# Patient Record
Sex: Male | Born: 1975 | Race: Black or African American | Hispanic: No | Marital: Married | Smoking: Current every day smoker
Health system: Southern US, Community
[De-identification: ages and names within clinical notes are randomized; demographics above are authoritative.]

## PROBLEM LIST (undated history)

## (undated) DIAGNOSIS — Q249 Congenital malformation of heart, unspecified: Secondary | ICD-10-CM

---

## 2002-12-29 ENCOUNTER — Emergency Department (HOSPITAL_COMMUNITY): Admission: EM | Admit: 2002-12-29 | Discharge: 2002-12-30 | Payer: Self-pay | Admitting: Emergency Medicine

## 2002-12-30 ENCOUNTER — Encounter: Payer: Self-pay | Admitting: Emergency Medicine

## 2003-09-26 ENCOUNTER — Emergency Department (HOSPITAL_COMMUNITY): Admission: EM | Admit: 2003-09-26 | Discharge: 2003-09-26 | Payer: Self-pay | Admitting: *Deleted

## 2004-08-09 ENCOUNTER — Emergency Department (HOSPITAL_COMMUNITY): Admission: EM | Admit: 2004-08-09 | Discharge: 2004-08-09 | Payer: Self-pay | Admitting: Emergency Medicine

## 2004-11-08 ENCOUNTER — Emergency Department (HOSPITAL_COMMUNITY): Admission: EM | Admit: 2004-11-08 | Discharge: 2004-11-08 | Payer: Self-pay | Admitting: Family Medicine

## 2007-04-04 ENCOUNTER — Emergency Department (HOSPITAL_COMMUNITY): Admission: EM | Admit: 2007-04-04 | Discharge: 2007-04-04 | Payer: Self-pay | Admitting: Emergency Medicine

## 2015-03-13 ENCOUNTER — Emergency Department (HOSPITAL_COMMUNITY): Payer: Self-pay

## 2015-03-13 ENCOUNTER — Encounter (HOSPITAL_COMMUNITY): Payer: Self-pay | Admitting: Emergency Medicine

## 2015-03-13 DIAGNOSIS — Z720 Tobacco use: Secondary | ICD-10-CM | POA: Insufficient documentation

## 2015-03-13 DIAGNOSIS — Q249 Congenital malformation of heart, unspecified: Secondary | ICD-10-CM | POA: Insufficient documentation

## 2015-03-13 DIAGNOSIS — J40 Bronchitis, not specified as acute or chronic: Secondary | ICD-10-CM | POA: Insufficient documentation

## 2015-03-13 DIAGNOSIS — Z79899 Other long term (current) drug therapy: Secondary | ICD-10-CM | POA: Insufficient documentation

## 2015-03-13 DIAGNOSIS — R0789 Other chest pain: Secondary | ICD-10-CM | POA: Insufficient documentation

## 2015-03-13 LAB — BASIC METABOLIC PANEL
Anion gap: 11 (ref 5–15)
BUN: 12 mg/dL (ref 6–20)
CALCIUM: 8.4 mg/dL — AB (ref 8.9–10.3)
CO2: 24 mmol/L (ref 22–32)
CREATININE: 1.26 mg/dL — AB (ref 0.61–1.24)
Chloride: 98 mmol/L — ABNORMAL LOW (ref 101–111)
Glucose, Bld: 100 mg/dL — ABNORMAL HIGH (ref 65–99)
Potassium: 3.2 mmol/L — ABNORMAL LOW (ref 3.5–5.1)
SODIUM: 133 mmol/L — AB (ref 135–145)

## 2015-03-13 LAB — CBC
HCT: 36.9 % — ABNORMAL LOW (ref 39.0–52.0)
Hemoglobin: 12.1 g/dL — ABNORMAL LOW (ref 13.0–17.0)
MCH: 22.9 pg — AB (ref 26.0–34.0)
MCHC: 32.8 g/dL (ref 30.0–36.0)
MCV: 69.8 fL — ABNORMAL LOW (ref 78.0–100.0)
PLATELETS: 201 10*3/uL (ref 150–400)
RBC: 5.29 MIL/uL (ref 4.22–5.81)
RDW: 15 % (ref 11.5–15.5)
WBC: 7.3 10*3/uL (ref 4.0–10.5)

## 2015-03-13 LAB — I-STAT TROPONIN, ED: TROPONIN I, POC: 0 ng/mL (ref 0.00–0.08)

## 2015-03-13 NOTE — ED Notes (Signed)
Pt reports that 2 hours ago he started having loss of appetite, cramping in stomach and chest. Pt concerned he is dehydrated. Denies nvd. Pt reports nasal congestion.

## 2015-03-14 ENCOUNTER — Emergency Department (HOSPITAL_COMMUNITY)
Admission: EM | Admit: 2015-03-14 | Discharge: 2015-03-14 | Disposition: A | Discharge status: Home / Self Care | Payer: Self-pay | Attending: Emergency Medicine | Admitting: Emergency Medicine

## 2015-03-14 DIAGNOSIS — R079 Chest pain, unspecified: Secondary | ICD-10-CM

## 2015-03-14 DIAGNOSIS — J40 Bronchitis, not specified as acute or chronic: Secondary | ICD-10-CM

## 2015-03-14 HISTORY — DX: Congenital malformation of heart, unspecified: Q24.9

## 2015-03-14 LAB — I-STAT TROPONIN, ED: Troponin i, poc: 0 ng/mL (ref 0.00–0.08)

## 2015-03-14 MED ORDER — PREDNISONE 20 MG PO TABS
60.0000 mg | ORAL_TABLET | Freq: Once | ORAL | Status: AC
  Administered 2015-03-14: 60 mg via ORAL
  Filled 2015-03-14: qty 3

## 2015-03-14 MED ORDER — ALBUTEROL SULFATE HFA 108 (90 BASE) MCG/ACT IN AERS: 2.0000 | INHALATION_SPRAY | RESPIRATORY_TRACT | Status: AC | PRN

## 2015-03-14 MED ORDER — PREDNISONE 20 MG PO TABS: 60.0000 mg | ORAL_TABLET | Freq: Every day | ORAL | Status: AC

## 2015-03-14 MED ORDER — ALBUTEROL SULFATE HFA 108 (90 BASE) MCG/ACT IN AERS
2.0000 | INHALATION_SPRAY | Freq: Once | RESPIRATORY_TRACT | Status: AC
  Administered 2015-03-14: 2 via RESPIRATORY_TRACT
  Filled 2015-03-14: qty 6.7

## 2015-03-14 MED ORDER — SODIUM CHLORIDE 0.9 % IV BOLUS (SEPSIS)
1000.0000 mL | Freq: Once | INTRAVENOUS | Status: AC
  Administered 2015-03-14: 1000 mL via INTRAVENOUS

## 2015-03-14 NOTE — ED Provider Notes (Signed)
CSN: [id number]     Arrival date & time 03/13/15  2208 History   This chart was scribed for Shon Baton, MD by Lyndel Safe, ED Scribe. This patient was seen in room A06C/A06C and the patient's care was started 12:26 AM.   No chief complaint on file.  The history is provided by the patient. No language interpreter was used.   HPI Comments: Daniel Hart is a 39 y.o. male, with a PMhx asthma, who presents to the Emergency Department complaining of sudden onset, intermittent, non-radiating, generalized chest pain that he describes as a tightness onset 5 hours ago. He additionally reports nasal congestion and feeling like he is dehydrated stating he has not been drinking water for the past several days. He does not note movement to exacerbate his chest pain. Pt states he has used an inhaler in the past. Denies cough, fever, SOB, nausea, vomiting, diarrhea, abdominal pain or sick contacts.   Past Medical History  Diagnosis Date  . Heart defect, congenital    History reviewed. No pertinent past surgical history. No family history on file. Social History  Substance Use Topics  . Smoking status: Current Every Day Smoker    Types: Cigarettes  . Smokeless tobacco: None  . Alcohol Use: No    Review of Systems  Constitutional: Negative for fever.  HENT: Positive for congestion.   Respiratory: Positive for chest tightness. Negative for cough and shortness of breath.   Gastrointestinal: Negative for nausea, vomiting, abdominal pain and diarrhea.  Skin: Negative for rash.  All other systems reviewed and are negative.  Allergies  Review of patient's allergies indicates no known allergies.  Home Medications   Prior to Admission medications   Medication Sig Start Date End Date Taking? Authorizing Provider  albuterol (PROVENTIL HFA;VENTOLIN HFA) 108 (90 BASE) MCG/ACT inhaler Inhale 2 puffs into the lungs every 4 (four) hours as needed for wheezing or shortness of breath. 03/14/15    Shon Baton, MD  predniSONE (DELTASONE) 20 MG tablet Take 3 tablets (60 mg total) by mouth daily with breakfast. 03/14/15   Shon Baton, MD   BP 124/71 mmHg  Pulse 71  Temp(Src) 98.1 F (36.7 C) (Oral)  Resp 16  SpO2 100% Physical Exam  Constitutional: He is oriented to person, place, and time. He appears well-developed and well-nourished. No distress.  HENT:  Head: Normocephalic and atraumatic.  Cardiovascular: Normal rate, regular rhythm and normal heart sounds.   No murmur heard. Pulmonary/Chest: Effort normal. No respiratory distress. He has wheezes. He exhibits no tenderness.  Abdominal: Soft. Bowel sounds are normal. There is no tenderness. There is no rebound.  Musculoskeletal: He exhibits no edema.  Neurological: He is alert and oriented to person, place, and time.  Skin: Skin is warm and dry.  Psychiatric: He has a normal mood and affect.  Nursing note and vitals reviewed.   ED Course  Procedures  DIAGNOSTIC STUDIES: Oxygen Saturation is 100% on RA, normal by my interpretation.    COORDINATION OF CARE: 12:32 AM Discussed treatment plan with pt. Pt acknowledges and agrees to plan.   Labs Review Labs Reviewed  BASIC METABOLIC PANEL - Abnormal; Notable for the following:    Sodium 133 (*)    Potassium 3.2 (*)    Chloride 98 (*)    Glucose, Bld 100 (*)    Creatinine, Ser 1.26 (*)    Calcium 8.4 (*)    All other components within normal limits  CBC - Abnormal; Notable for the  following:    Hemoglobin 12.1 (*)    HCT 36.9 (*)    MCV 69.8 (*)    MCH 22.9 (*)    All other components within normal limits  I-STAT TROPOININ, ED  Rosezena Sensor, ED    Imaging Review Dg Chest 2 View  03/13/2015   CLINICAL DATA:  Chest pain and cramping tonight. 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  EXAM: CHEST  2 VIEW  COMPARISON:  08/17/2004  FINDINGS: Single clip overlies the middle mediastinum. The cardiomediastinal contours are unchanged.  Mild hyperinflation and bronchial thickening. Pulmonary vasculature is normal. Small calcified granulomas in the right midlung zone. No consolidation, pleural effusion, or pneumothorax. No acute osseous abnormalities are seen. Defect about posterior left fourth rib is unchanged and likely postsurgical.  IMPRESSION: Mild hyperinflation and bronchial thickening, can be seen with bronchitis or asthma.   Electronically Signed   By: Rubye Oaks M.D.   On: 03/13/2015 23:09   I have personally reviewed and evaluated these images and lab results as part of my medical decision-making.   EKG Interpretation   Date/Time:  Tuesday March 13 2015 22:12:55 EDT Ventricular Rate:  83 PR Interval:  162 QRS Duration: 110 QT Interval:  396 QTC Calculation: 465 R Axis:   95 Text Interpretation:  Normal sinus rhythm Rightward axis T wave  abnormality, consider inferior ischemia Prolonged QT Abnormal ECG T wave  inversion inferiorly and laterally Confirmed by Charli Halle  MD, Clayton Jarmon  ([id number]) on 03/14/2015 1:05:38 AM      MDM   Final diagnoses:  Chest pain, unspecified chest pain type  Bronchitis    Patient presents with chest pain. Also reports congestion. Current smoker. Wheezing on exam. Otherwise nontoxic and satting 100% on room air. Suspect chest pain is related to acute bronchitis. EKG reassuring and delta troponin is negative. Chest x-ray shows bronchial thickening consistent with bronchitis. Patient given an inhaler and steroids.  Patient with some improvement. Will discharge with steroids and inhaler at home.  After history, exam, and medical workup I feel the patient has been appropriately medically screened and is safe for discharge home. Pertinent diagnoses were discussed with the patient. Patient was given return precautions.  I personally performed the services described in this documentation, which was scribed in my presence. The recorded information has been reviewed and is  accurate.    Shon Baton, MD 03/14/15 [phone]

## 2015-03-14 NOTE — Discharge Instructions (Signed)
You were seen today for chest pain. You are wheezing and likely have some element of bronchitis.  You should quit smoking. You will be placed on steroids and an inhaler.  Chest Pain (Nonspecific) It is often hard to give a diagnosis for the cause of chest pain. There is always a chance that your pain could be related to something serious, such as a heart attack or a blood clot in the lungs. You need to follow up with your doctor. HOME CARE  If antibiotic medicine was given, take it as directed by your doctor. Finish the medicine even if you start to feel better.  For the next few days, avoid activities that bring on chest pain. Continue physical activities as told by your doctor.  Do not use any tobacco products. This includes cigarettes, chewing tobacco, and e-cigarettes.  Avoid drinking alcohol.  Only take medicine as told by your doctor.  Follow your doctor's suggestions for more testing if your chest pain does not go away.  Keep all doctor visits you made. GET HELP IF:  Your chest pain does not go away, even after treatment.  You have a rash with blisters on your chest.  You have a fever. GET HELP RIGHT AWAY IF:   You have more pain or pain that spreads to your arm, neck, jaw, back, or belly (abdomen).  You have shortness of breath.  You cough more than usual or cough up blood.  You have very bad back or belly pain.  You feel sick to your stomach (nauseous) or throw up (vomit).  You have very bad weakness.  You pass out (faint).  You have chills. This is an emergency. Do not wait to see if the problems will go away. Call your local emergency services (911 in [place] Do not drive yourself to the hospital. MAKE SURE YOU:   Understand these instructions.  Will watch your condition.  Will get help right away if you are not doing well or get worse. Document Released: 12/24/2007 Document Revised: 07/12/2013 Document Reviewed: 12/24/2007 [facility] Patient Information  2015 [place], [place]. This information is not intended to replace advice given to you by your health care provider. Make sure you discuss any questions you have with your health care provider. Acute Bronchitis Bronchitis is inflammation of the airways that extend from the windpipe into the lungs (bronchi). The inflammation often causes mucus to develop. This leads to a cough, which is the most common symptom of bronchitis.  In acute bronchitis, the condition usually develops suddenly and goes away over time, usually in a couple weeks. Smoking, allergies, and asthma can make bronchitis worse. Repeated episodes of bronchitis may cause further lung problems.  CAUSES Acute bronchitis is most often caused by the same virus that causes a cold. The virus can spread from person to person (contagious) through coughing, sneezing, and touching contaminated objects. SIGNS AND SYMPTOMS   Cough.   Fever.   Coughing up mucus.   Body aches.   Chest congestion.   Chills.   Shortness of breath.   Sore throat.  DIAGNOSIS  Acute bronchitis is usually diagnosed through a physical exam. Your health care provider will also ask you questions about your medical history. Tests, such as chest X-rays, are sometimes done to rule out other conditions.  TREATMENT  Acute bronchitis usually goes away in a couple weeks. Oftentimes, no medical treatment is necessary. Medicines are sometimes given for relief of fever or cough. Antibiotic medicines are usually not needed but may  be prescribed in certain situations. In some cases, an inhaler may be recommended to help reduce shortness of breath and control the cough. A cool mist vaporizer may also be used to help thin bronchial secretions and make it easier to clear the chest.  HOME CARE INSTRUCTIONS  Get plenty of rest.   Drink enough fluids to keep your urine clear or pale yellow (unless you have a medical condition that requires fluid restriction). Increasing  fluids may help thin your respiratory secretions (sputum) and reduce chest congestion, and it will prevent dehydration.   Take medicines only as directed by your health care provider.  If you were prescribed an antibiotic medicine, finish it all even if you start to feel better.  Avoid smoking and secondhand smoke. Exposure to cigarette smoke or irritating chemicals will make bronchitis worse. If you are a smoker, consider using nicotine gum or skin patches to help control withdrawal symptoms. Quitting smoking will help your lungs heal faster.   Reduce the chances of another bout of acute bronchitis by washing your hands frequently, avoiding people with cold symptoms, and trying not to touch your hands to your mouth, nose, or eyes.   Keep all follow-up visits as directed by your health care provider.  SEEK MEDICAL CARE IF: Your symptoms do not improve after 1 week of treatment.  SEEK IMMEDIATE MEDICAL CARE IF:  You develop an increased fever or chills.   You have chest pain.   You have severe shortness of breath.  You have bloody sputum.   You develop dehydration.  You faint or repeatedly feel like you are going to pass out.  You develop repeated vomiting.  You develop a severe headache. MAKE SURE YOU:   Understand these instructions.  Will watch your condition.  Will get help right away if you are not doing well or get worse. Document Released: 08/14/2004 Document Revised: 11/21/2013 Document Reviewed: 12/28/2012 [facility] Patient Information 2015 [place], [place]. This information is not intended to replace advice given to you by your health care provider. Make sure you discuss any questions you have with your health care provider.

## 2017-06-28 IMAGING — DX DG CHEST 2V
2 series · 2 of 2 positions shown · non-contrast
Comparison: 08/17/2004

CLINICAL DATA: Chest pain and cramping tonight. History of
congenital heart defect postsurgical repair as a baby.

EXAM:
CHEST  2 VIEW

[chest pa]
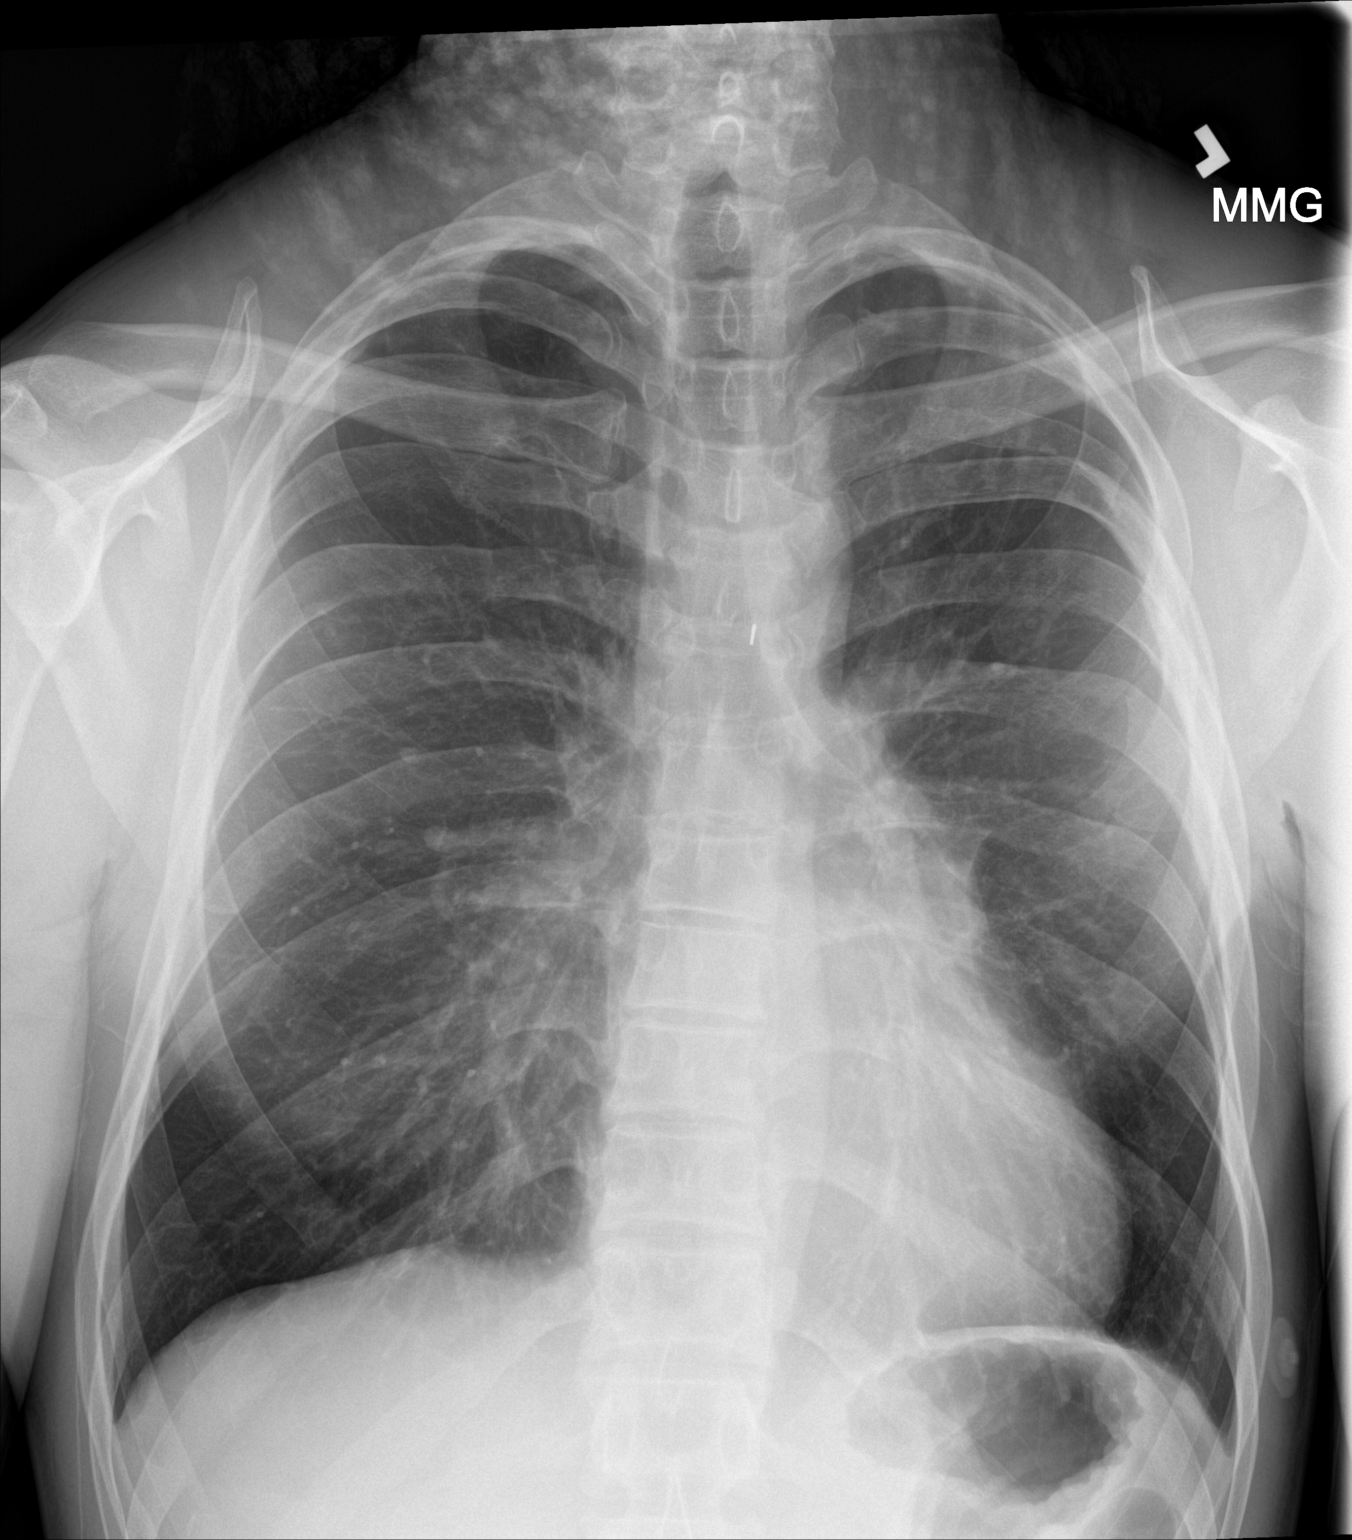

[chest lat]
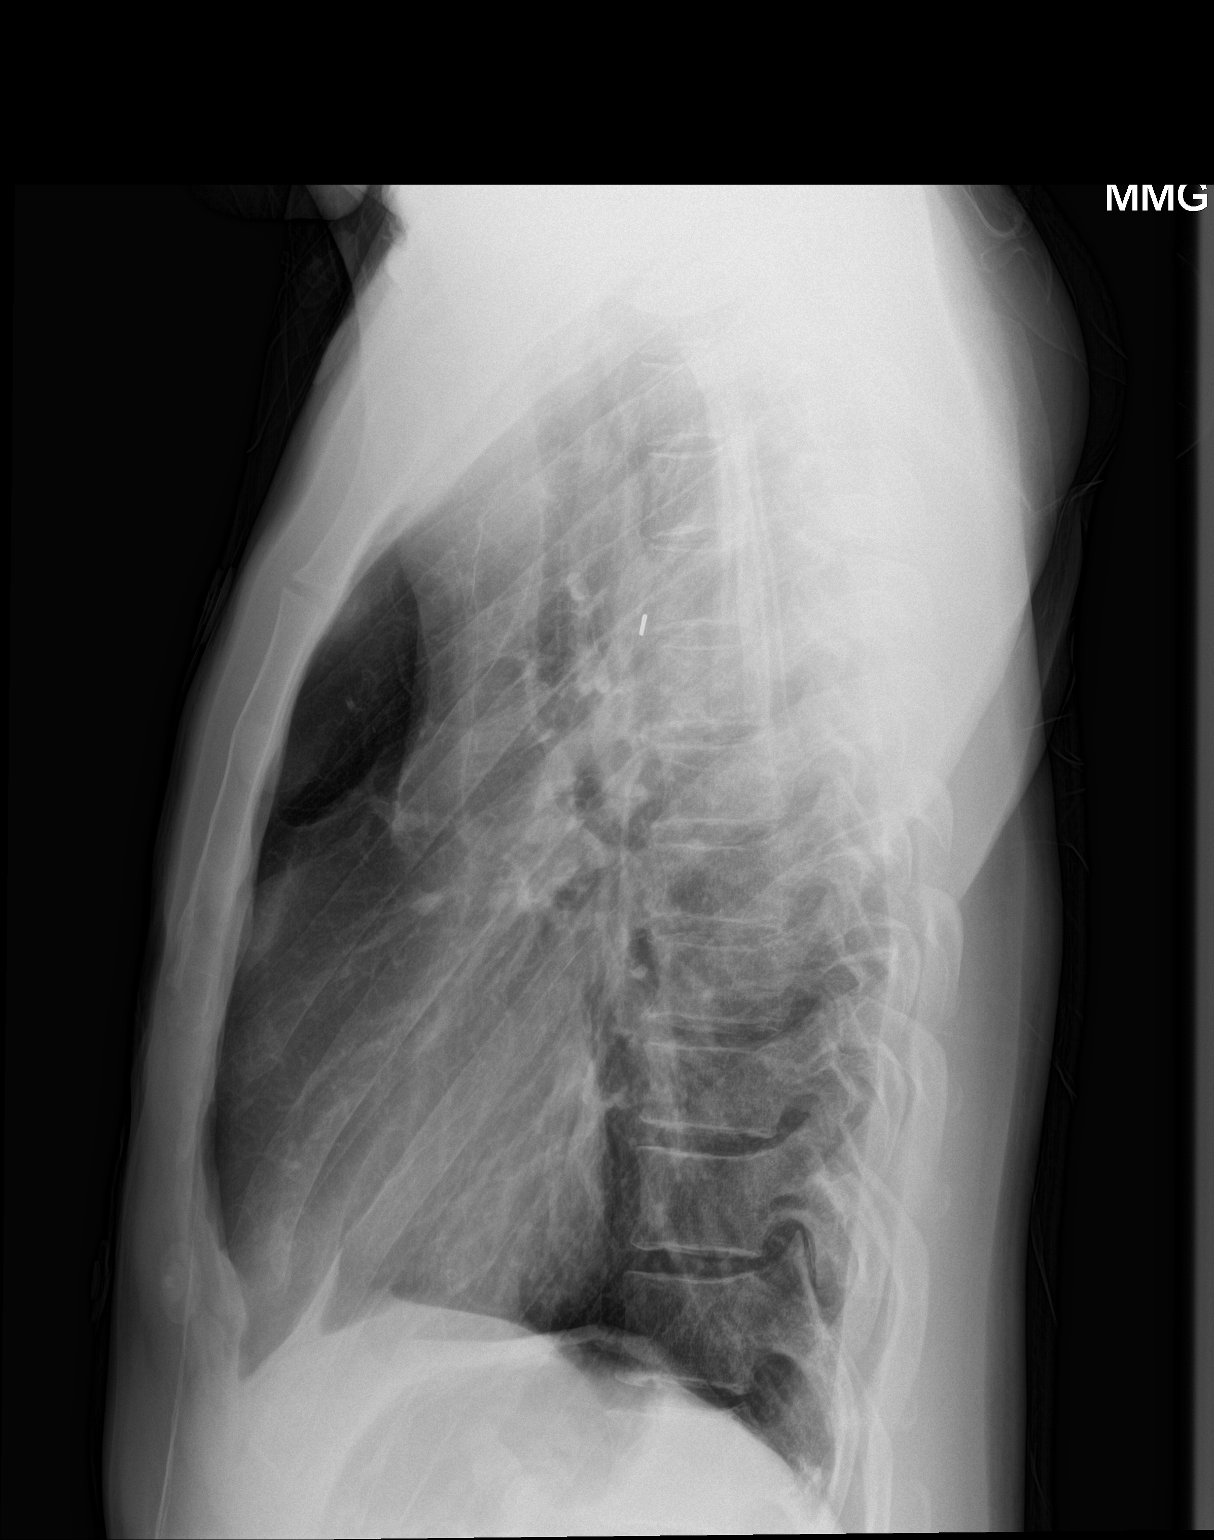

[2 of 2 positions shown; findings below may reference images not displayed]

FINDINGS: Single clip overlies the middle mediastinum. The cardiomediastinal
contours are unchanged. Mild hyperinflation and bronchial
thickening. Pulmonary vasculature is normal. Small calcified
granulomas in the right midlung zone. No consolidation, pleural
effusion, or pneumothorax. No acute osseous abnormalities are seen.
Defect about posterior left fourth rib is unchanged and likely
postsurgical.
IMPRESSION: Mild hyperinflation and bronchial thickening, can be seen with
bronchitis or asthma.

## 2018-07-21 DEATH — deceased
